# Patient Record
Sex: Male | Born: 1960 | Race: White | Hispanic: No | Marital: Married | Smoking: Former smoker
Health system: Southern US, Community
[De-identification: ages and names within clinical notes are randomized; demographics above are authoritative.]

## PROBLEM LIST (undated history)

## (undated) HISTORY — PX: APPENDECTOMY: SHX54

## (undated) HISTORY — PX: COLON SURGERY: SHX602

## (undated) HISTORY — PX: CHOLECYSTECTOMY: SHX55

---

## 2010-08-20 ENCOUNTER — Emergency Department (HOSPITAL_BASED_OUTPATIENT_CLINIC_OR_DEPARTMENT_OTHER)
Admission: EM | Admit: 2010-08-20 | Discharge: 2010-08-20 | Disposition: A | Discharge status: Other Acute Inpatient Hospital | Payer: BC Managed Care – PPO | Attending: Emergency Medicine | Admitting: Emergency Medicine

## 2010-08-20 ENCOUNTER — Emergency Department (INDEPENDENT_AMBULATORY_CARE_PROVIDER_SITE_OTHER): Payer: BC Managed Care – PPO

## 2010-08-20 DIAGNOSIS — R109 Unspecified abdominal pain: Secondary | ICD-10-CM

## 2010-08-20 DIAGNOSIS — K7689 Other specified diseases of liver: Secondary | ICD-10-CM | POA: Insufficient documentation

## 2010-08-20 DIAGNOSIS — R110 Nausea: Secondary | ICD-10-CM

## 2010-08-20 DIAGNOSIS — R1031 Right lower quadrant pain: Secondary | ICD-10-CM | POA: Insufficient documentation

## 2010-08-20 DIAGNOSIS — K389 Disease of appendix, unspecified: Secondary | ICD-10-CM

## 2010-08-20 DIAGNOSIS — K3580 Unspecified acute appendicitis: Secondary | ICD-10-CM | POA: Insufficient documentation

## 2010-08-20 LAB — COMPREHENSIVE METABOLIC PANEL
AST: 28 U/L (ref 0–37)
Albumin: 4.6 g/dL (ref 3.5–5.2)
Alkaline Phosphatase: 85 U/L (ref 39–117)
Chloride: 101 mEq/L (ref 96–112)
Creatinine, Ser: 1.2 mg/dL (ref 0.4–1.5)
GFR calc Af Amer: >60 mL/min (ref >60)
Potassium: 4 mEq/L (ref 3.5–5.1)
Sodium: 139 mEq/L (ref 135–145)
Total Bilirubin: 2.7 mg/dL — ABNORMAL HIGH (ref 0.3–1.2)

## 2010-08-20 LAB — URINE MICROSCOPIC-ADD ON

## 2010-08-20 LAB — URINALYSIS, ROUTINE W REFLEX MICROSCOPIC
Hgb urine dipstick: NEGATIVE
Ketones, ur: 15 mg/dL — AB
Protein, ur: 30 mg/dL — AB
Urine Glucose, Fasting: NEGATIVE mg/dL

## 2010-08-20 LAB — CBC
Hemoglobin: 16.7 g/dL (ref 13.0–17.0)
MCH: 30.2 pg (ref 26.0–34.0)
MCHC: 35.6 g/dL (ref 30.0–36.0)

## 2010-08-20 LAB — DIFFERENTIAL
Basophils Relative: 0 % (ref 0–1)
Monocytes Absolute: 2.4 10*3/uL — ABNORMAL HIGH (ref 0.1–1.0)
Monocytes Relative: 10 % (ref 3–12)
Neutro Abs: 17.8 10*3/uL — ABNORMAL HIGH (ref 1.7–7.7)

## 2010-08-20 MED ORDER — IOHEXOL 300 MG/ML  SOLN
100.0000 mL | Freq: Once | INTRAMUSCULAR | Status: AC | PRN
  Administered 2010-08-20: 100 mL via INTRAVENOUS

## 2010-11-26 ENCOUNTER — Emergency Department (HOSPITAL_BASED_OUTPATIENT_CLINIC_OR_DEPARTMENT_OTHER)
Admission: EM | Admit: 2010-11-26 | Discharge: 2010-11-26 | Disposition: A | Discharge status: Home / Self Care | Payer: BC Managed Care – PPO | Attending: Emergency Medicine | Admitting: Emergency Medicine

## 2010-11-26 ENCOUNTER — Emergency Department (INDEPENDENT_AMBULATORY_CARE_PROVIDER_SITE_OTHER): Payer: BC Managed Care – PPO

## 2010-11-26 DIAGNOSIS — R29810 Facial weakness: Secondary | ICD-10-CM | POA: Insufficient documentation

## 2010-11-26 DIAGNOSIS — H547 Unspecified visual loss: Secondary | ICD-10-CM

## 2010-11-26 DIAGNOSIS — R51 Headache: Secondary | ICD-10-CM

## 2010-11-26 DIAGNOSIS — R209 Unspecified disturbances of skin sensation: Secondary | ICD-10-CM | POA: Insufficient documentation

## 2010-11-26 DIAGNOSIS — G510 Bell's palsy: Secondary | ICD-10-CM | POA: Insufficient documentation

## 2012-02-07 ENCOUNTER — Encounter (HOSPITAL_BASED_OUTPATIENT_CLINIC_OR_DEPARTMENT_OTHER): Payer: Self-pay

## 2012-02-07 ENCOUNTER — Emergency Department (HOSPITAL_BASED_OUTPATIENT_CLINIC_OR_DEPARTMENT_OTHER): Payer: BC Managed Care – PPO

## 2012-02-07 ENCOUNTER — Emergency Department (HOSPITAL_BASED_OUTPATIENT_CLINIC_OR_DEPARTMENT_OTHER)
Admission: EM | Admit: 2012-02-07 | Discharge: 2012-02-07 | Disposition: A | Discharge status: Home / Self Care | Payer: BC Managed Care – PPO | Attending: Emergency Medicine | Admitting: Emergency Medicine

## 2012-02-07 DIAGNOSIS — M545 Low back pain, unspecified: Secondary | ICD-10-CM | POA: Insufficient documentation

## 2012-02-07 DIAGNOSIS — R109 Unspecified abdominal pain: Secondary | ICD-10-CM | POA: Insufficient documentation

## 2012-02-07 DIAGNOSIS — Z9089 Acquired absence of other organs: Secondary | ICD-10-CM | POA: Insufficient documentation

## 2012-02-07 LAB — URINALYSIS, ROUTINE W REFLEX MICROSCOPIC
Nitrite: NEGATIVE
Specific Gravity, Urine: 1.026 (ref 1.005–1.030)
Urobilinogen, UA: 0.2 mg/dL (ref 0.0–1.0)

## 2012-02-07 MED ORDER — OXYCODONE-ACETAMINOPHEN 5-325 MG PO TABS: 1.0000 | ORAL_TABLET | ORAL | Status: AC | PRN

## 2012-02-07 MED ORDER — CYCLOBENZAPRINE HCL 10 MG PO TABS: 10.0000 mg | ORAL_TABLET | Freq: Three times a day (TID) | ORAL | Status: AC | PRN

## 2012-02-07 NOTE — ED Notes (Signed)
Pt reports right side/flank pain that started two months ago and worsened this morning.

## 2012-02-07 NOTE — ED Provider Notes (Signed)
History     CSN: [id number]  Arrival date & time 02/07/12  1422   First MD Initiated Contact with Patient 02/07/12 1623      Chief Complaint  Patient presents with  . Flank Pain    (Consider location/radiation/quality/duration/timing/severity/associated sxs/prior treatment) HPI Comments: HPI Comments: The patient is a 51 year old man who woke up this morning with pain in the right side and back. Longer the day went towards heard. He's had no blood in the urine he denies any injury. His health is usually very good his only prior medical care recently was an appendectomy last year.  Patient is a 51 y.o. male presenting with back pain. The history is provided by the patient and medical records. No language interpreter was used.  Back Pain  This is a new problem. The current episode started 6 to 12 hours ago. The problem occurs constantly. Progression since onset: He has less pain lying flat or standing, more sitting riding in a car. The pain is associated with no known injury. The pain is present in the lumbar spine. The quality of the pain is described as stabbing. The pain does not radiate. Pain scale: His pain is an 820 moving around, it to when he is at rest. Pain severity now:  sitting,  riding in a car. Pertinent negatives include no fever.    Patient is a 51 y.o. male presenting with flank pain.  Flank Pain    History reviewed. No pertinent past medical history.  Past Surgical History  Procedure Date  . Appendectomy     No family history on file.  History  Substance Use Topics  . Smoking status: Never Smoker   . Smokeless tobacco: Never Used  . Alcohol Use: Yes     rarely      Review of Systems  Constitutional: Negative.   HENT: Negative.   Eyes: Negative.   Respiratory: Negative.   Cardiovascular: Negative.   Gastrointestinal: Negative.   Genitourinary: Positive for flank pain.  Musculoskeletal: Positive for back pain.  Skin: Negative.   Neurological:  Negative.   Psychiatric/Behavioral: Negative.     Allergies  Review of patient's allergies indicates no known allergies.  Home Medications   Current Outpatient Rx  Name Route Sig Dispense Refill  . NAPROXEN PO Oral Take 2 tablets by mouth daily as needed. For pain.    . CYCLOBENZAPRINE HCL 10 MG PO TABS Oral Take 1 tablet (10 mg total) by mouth 3 (three) times daily as needed for muscle spasms. 15 tablet 0  . OXYCODONE-ACETAMINOPHEN 5-325 MG PO TABS Oral Take 1 tablet by mouth every 4 (four) hours as needed for pain. 20 tablet 0    BP 155/97  Pulse 63  Temp 98.1 F (36.7 C) (Oral)  Resp 20  Ht 6\' 1"  (1.854 m)  Wt 305 lb (138.347 kg)  BMI 40.24 kg/m2  SpO2 99%  Physical Exam  Nursing note and vitals reviewed. Constitutional: He is oriented to person, place, and time.       Robust middle aged man, in mild distress, supine on his back.  HENT:  Head: Normocephalic and atraumatic.  Right Ear: External ear normal.  Left Ear: External ear normal.  Mouth/Throat: Oropharynx is clear and moist.  Eyes: Conjunctivae and EOM are normal. Pupils are equal, round, and reactive to light.  Neck: Normal range of motion. Neck supple. No thyromegaly present.  Cardiovascular: Normal rate, regular rhythm and normal heart sounds.   Pulmonary/Chest: Breath sounds normal.  Abdominal:  Soft. Bowel sounds are normal.  Musculoskeletal:       He localizes pain to the right flank.  There is no bony deformity of the lumbar region.    Neurological: He is alert and oriented to person, place, and time.       No sensory or motor deficit.  Skin: Skin is warm and dry.  Psychiatric: He has a normal mood and affect. His behavior is normal.    ED Course  Procedures (including critical care time)   Labs Reviewed  URINALYSIS, ROUTINE W REFLEX MICROSCOPIC   Ct Abdomen Pelvis Wo Contrast  02/07/2012  *RADIOLOGY REPORT*  Clinical Data: Right flank pain  CT ABDOMEN AND PELVIS WITHOUT CONTRAST  Technique:   Multidetector CT imaging of the abdomen and pelvis was performed following the standard protocol without intravenous contrast.  Comparison: 08/20/2010  Findings: No hydronephrosis.  No renal calculus.  Benign cyst in the left kidney.  No ureteral calculus.  Post cholecystectomy.  Diffuse hepatic steatosis.  Spleen, pancreas, adrenal glands are within normal limits.  No free fluid.  Post appendectomy.  Bladder is decompressed.  Unremarkable prostate.  No destructive bone lesion.  Mild degenerative changes in the lumbar spine.  IMPRESSION: No urinary calculus.  Original Report Authenticated By: Donavan Burnet, M.D.   UA negative and CT of abdomen/pelvis showed no evidence of kidney stone.  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  Rx for Low back pain with Percocet for pain, Flexeril for muscle spasm, rest on firm surface with heating pad on a low setting.  1. Low back pain            Carleene Cooper III, MD 02/07/12 2148

## 2017-01-31 ENCOUNTER — Encounter (HOSPITAL_BASED_OUTPATIENT_CLINIC_OR_DEPARTMENT_OTHER): Payer: Self-pay

## 2017-01-31 ENCOUNTER — Emergency Department (HOSPITAL_BASED_OUTPATIENT_CLINIC_OR_DEPARTMENT_OTHER)
Admission: EM | Admit: 2017-01-31 | Discharge: 2017-01-31 | Disposition: A | Discharge status: Home / Self Care | Payer: BLUE CROSS/BLUE SHIELD | Attending: Emergency Medicine | Admitting: Emergency Medicine

## 2017-01-31 ENCOUNTER — Emergency Department (HOSPITAL_BASED_OUTPATIENT_CLINIC_OR_DEPARTMENT_OTHER): Payer: BLUE CROSS/BLUE SHIELD

## 2017-01-31 DIAGNOSIS — M549 Dorsalgia, unspecified: Secondary | ICD-10-CM | POA: Diagnosis present

## 2017-01-31 DIAGNOSIS — Z87891 Personal history of nicotine dependence: Secondary | ICD-10-CM | POA: Diagnosis not present

## 2017-01-31 DIAGNOSIS — M545 Low back pain, unspecified: Secondary | ICD-10-CM

## 2017-01-31 MED ORDER — NAPROXEN 375 MG PO TABS: 375.0000 mg | ORAL_TABLET | Freq: Two times a day (BID) | ORAL | 0 refills | Status: AC

## 2017-01-31 MED ORDER — CYCLOBENZAPRINE HCL 10 MG PO TABS: 10.0000 mg | ORAL_TABLET | Freq: Two times a day (BID) | ORAL | 0 refills | Status: AC | PRN

## 2017-01-31 MED ORDER — ACETAMINOPHEN 500 MG PO TABS: 500.0000 mg | ORAL_TABLET | Freq: Four times a day (QID) | ORAL | 0 refills | Status: AC | PRN

## 2017-01-31 NOTE — Discharge Instructions (Signed)
Medications: Flexeril, naprosyn, Tylenol  Treatment: Take Flexeril twice daily for muscle pain and spasms. Do not drive or operate machinery while taking this medication as it can make you very sleepy. Take naprosyn twice daily as prescribed for your pain. You can alternate with Tylenol as prescribed. Use a heating pad 3-4 times daily alternating 20 mintues on, 20 minutes off. Attempt the back exercises and strecthes 1-2 times daily as tolerated.  Follow-up: Please follow up with your primary care provider or the orthopedic doctor outlined below if your symptoms are not improving over the next 7-10 days. Please return to the emergency department if you develop any new or worsening symptoms, including complete numbness of your legs or groin area, loss of bowel or bladder control, inability to walk, or any other new or concerning symptom.

## 2017-01-31 NOTE — ED Triage Notes (Signed)
C/o lower back pain x 5 months after crawling through a car window-NAD-steady gait

## 2017-01-31 NOTE — ED Notes (Signed)
ED Provider at bedside. 

## 2017-01-31 NOTE — ED Provider Notes (Signed)
[facility] Provider Note   CSN: [id number] Arrival date & time: 01/31/17  1308     History   Chief Complaint Chief Complaint  Patient presents with  . Back Pain    HPI Blake Shaw is a 56 y.o. male who is previously healthy who presents with a 5 month history of low back pain. His pain started after crawling in and out of the car when his car broke down and he could not open the door to choke are helping him. He reports resting his back on the windowsill and hearing several pops. Incident, patient has had low back pain. He occasionally has pain radiating to his right mid thigh, however no other radiation of pain. He denies any numbness or tingling. He also denies any bowel or bladder incontinence, saddle anesthesia, fevers, weight loss, urinary symptoms, recent procedure to back, known cancer, history of IVDU. He has not tried any medication at home for his symptoms. He has tried some stretching and exercises as well as hot showers, however his pain persists.   HPI  History reviewed. No pertinent past medical history.  There are no active problems to display for this patient.   Past Surgical History:  Procedure Laterality Date  . APPENDECTOMY    . CHOLECYSTECTOMY    . COLON SURGERY         Home Medications    Prior to Admission medications   Medication Sig Start Date End Date Taking? Authorizing Provider  acetaminophen (TYLENOL) 500 MG tablet Take 1 tablet (500 mg total) by mouth every 6 (six) hours as needed. 01/31/17   Donye Campanelli, Waylan BogaAlexandra M, PA-C  cyclobenzaprine (FLEXERIL) 10 MG tablet Take 1 tablet (10 mg total) by mouth 2 (two) times daily as needed for muscle spasms. 01/31/17   Merrily Tegeler, Waylan BogaAlexandra M, PA-C  naproxen (NAPROSYN) 375 MG tablet Take 1 tablet (375 mg total) by mouth 2 (two) times daily. 01/31/17   Emi HolesLaw, Kassidie Hendriks M, PA-C    Family History No family history on file.  Social History Social History  Substance Use Topics  . Smoking status: Former Games developermoker    . Smokeless tobacco: Never Used  . Alcohol use No     Allergies   Patient has no known allergies.   Review of Systems Review of Systems  Constitutional: Negative for chills and fever.  HENT: Negative for facial swelling and sore throat.   Genitourinary: Negative for dysuria.  Musculoskeletal: Positive for back pain.  Skin: Negative for rash and wound.  Neurological: Negative for numbness and headaches.  Psychiatric/Behavioral: The patient is not nervous/anxious.      Physical Exam Updated Vital Signs BP (!) 151/107 (BP Location: Left Arm)   Pulse 88   Temp 98.9 F (37.2 C) (Oral)   Resp 20   Ht 6\' 2"  (1.88 m)   Wt (!) 138.3 kg (305 lb)   SpO2 98%   BMI 39.16 kg/m   Physical Exam  Constitutional: He appears well-developed and well-nourished. No distress.  HENT:  Head: Normocephalic and atraumatic.  Mouth/Throat: Oropharynx is clear and moist. No oropharyngeal exudate.  Eyes: Pupils are equal, round, and reactive to light. Conjunctivae are normal. Right eye exhibits no discharge. Left eye exhibits no discharge. No scleral icterus.  Neck: Normal range of motion. Neck supple. No thyromegaly present.  Cardiovascular: Normal rate, regular rhythm, normal heart sounds and intact distal pulses.  Exam reveals no gallop and no friction rub.   No murmur heard. Pulmonary/Chest: Effort normal and breath  sounds normal. No stridor. No respiratory distress. He has no wheezes. He has no rales.  Abdominal: Soft. Bowel sounds are normal. He exhibits no distension. There is no tenderness. There is no rebound and no guarding.  Musculoskeletal: He exhibits no edema.       Lumbar back: He exhibits tenderness, bony tenderness and spasm.       Back:  Lymphadenopathy:    He has no cervical adenopathy.  Neurological: He is alert. Coordination normal.  Reflex Scores:      Patellar reflexes are 2+ on the right side and 2+ on the left side. Normal sensation and 5/5 strength lower extremities   Skin: Skin is warm and dry. No rash noted. He is not diaphoretic. No pallor.  Psychiatric: He has a normal mood and affect.  Nursing note and vitals reviewed.    ED Treatments / Results  Labs (all labs ordered are listed, but only abnormal results are displayed) Labs Reviewed - No data to display  EKG  EKG Interpretation None       Radiology Dg Lumbar Spine Complete  Result Date: 01/31/2017 CLINICAL DATA:  Low back pain 5 months EXAM: LUMBAR SPINE - COMPLETE 4+ VIEW COMPARISON:  CT abdomen pelvis 02/07/2012 FINDINGS: Normal alignment. Negative for fracture or pars defect. Mild disc space narrowing and spurring at L1-2, L2-3, L3-4. L4-5 and L5-S1 intact. IMPRESSION: Mild degenerative change.  No acute abnormality. Electronically Signed   By: Marlan Palauharles  Clark M.D.   On: 01/31/2017 13:57    Procedures Procedures (including critical care time)  Medications Ordered in ED Medications - No data to display   Initial Impression / Assessment and Plan / ED Course  I have reviewed the triage vital signs and the nursing notes.  Pertinent labs & imaging results that were available during my care of the patient were reviewed by me and considered in my medical decision making (see chart for details).     Patient with back pain.  X-ray shows mild degenerative change without acute abnormality. No neurological deficits and normal neuro exam.  Patient is ambulatory.  No loss of bowel or bladder control.  No concern for cauda equina.  No fever, night sweats, weight loss, h/o cancer, IVDA, no recent procedure to back. No urinary symptoms suggestive of UTI.  Supportive care, including Flexeril, Naprosyn, Tylenol, stretching/exercises, heat, and return precaution discussed. Appears safe for discharge at this time. Follow up With PCP or sports medicine, Dr. Pearletha ForgeHudnall. Patient understands and agrees with plan. Patient vitals stable throughout ED course discharged in satisfactory condition  Final Clinical  Impressions(s) / ED Diagnoses   Final diagnoses:  Acute bilateral low back pain without sciatica    New Prescriptions New Prescriptions   ACETAMINOPHEN (TYLENOL) 500 MG TABLET    Take 1 tablet (500 mg total) by mouth every 6 (six) hours as needed.   CYCLOBENZAPRINE (FLEXERIL) 10 MG TABLET    Take 1 tablet (10 mg total) by mouth 2 (two) times daily as needed for muscle spasms.   NAPROXEN (NAPROSYN) 375 MG TABLET    Take 1 tablet (375 mg total) by mouth 2 (two) times daily.     Emi HolesLaw, Brenda Samano M, PA-C 01/31/17 1407    Benjiman CorePickering, Nathan, MD 01/31/17 [phone]

## 2017-12-11 IMAGING — CR DG LUMBAR SPINE COMPLETE 4+V
5 series · 5 of 5 positions shown · non-contrast
Comparison: CT abdomen pelvis 02/07/2012

CLINICAL DATA: Low back pain 5 months

EXAM:
LUMBAR SPINE - COMPLETE 4+ VIEW

[t l-spine a.p.]
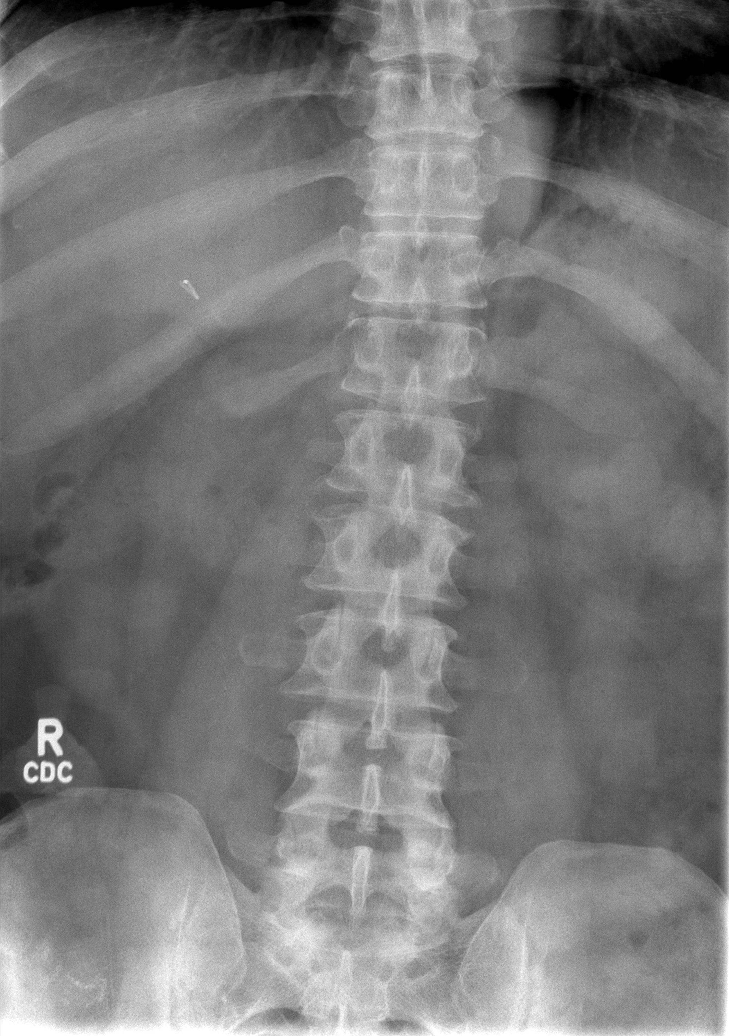

[t l-spine oblique exposure (1 of 2)]
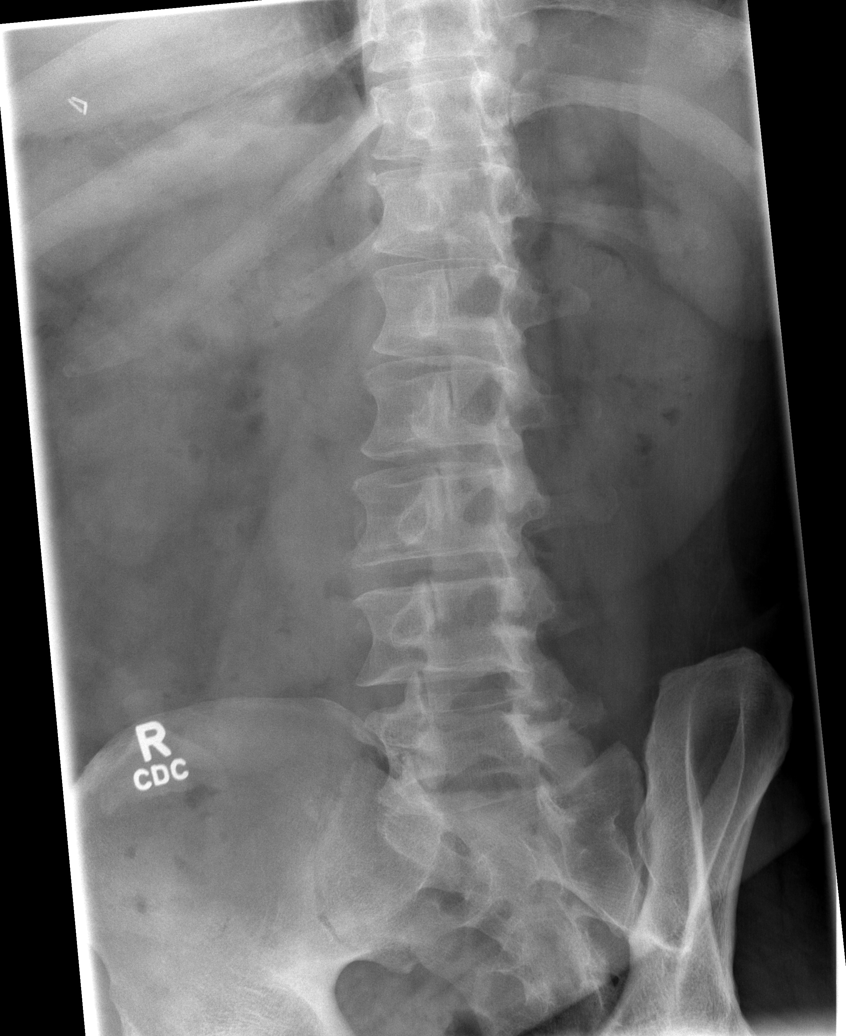

[t l-spine oblique exposure (2 of 2)]
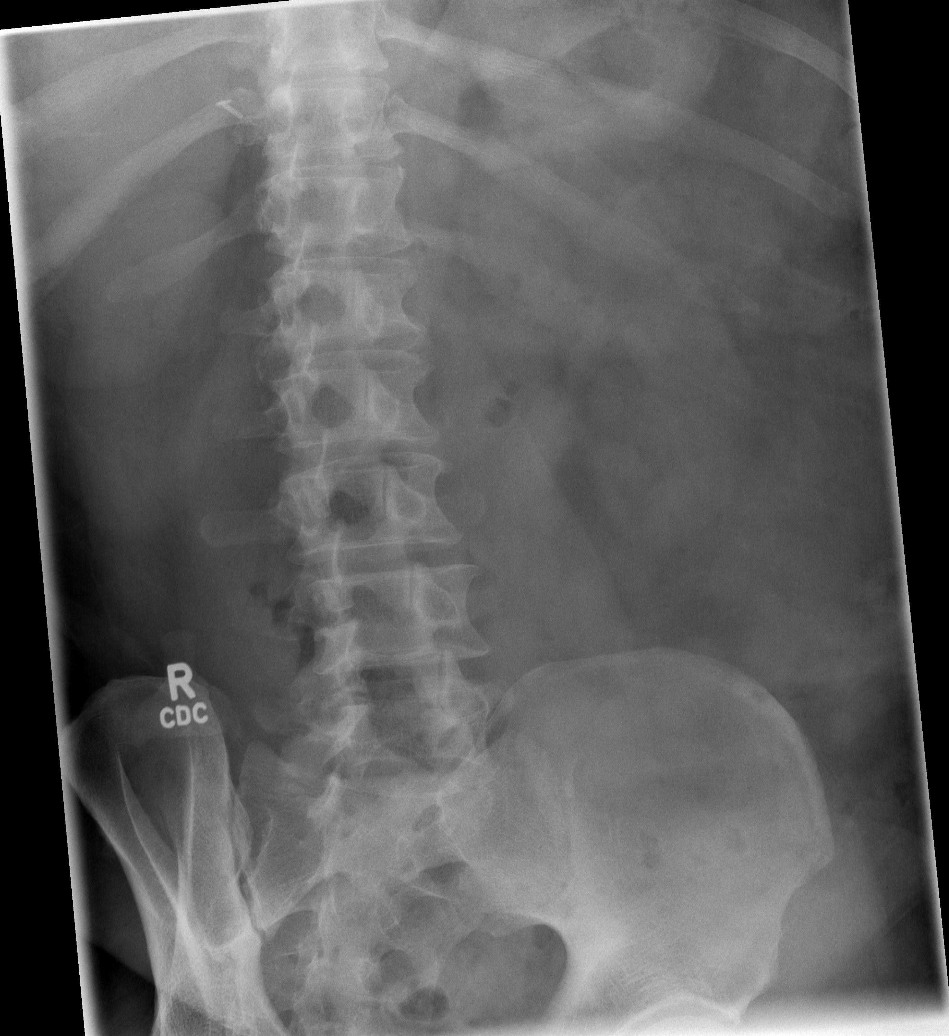

[t l-spine lat]
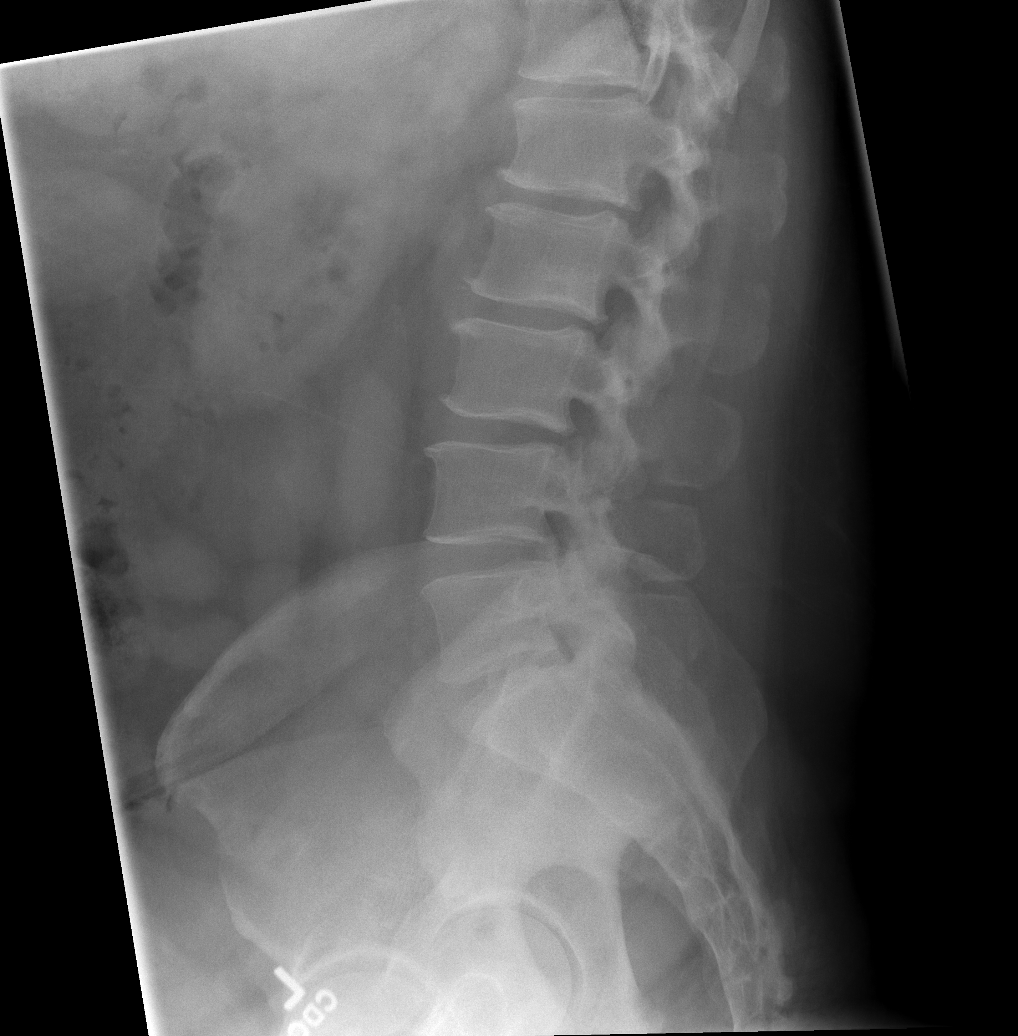

[t l-spine l5-s1 spot]
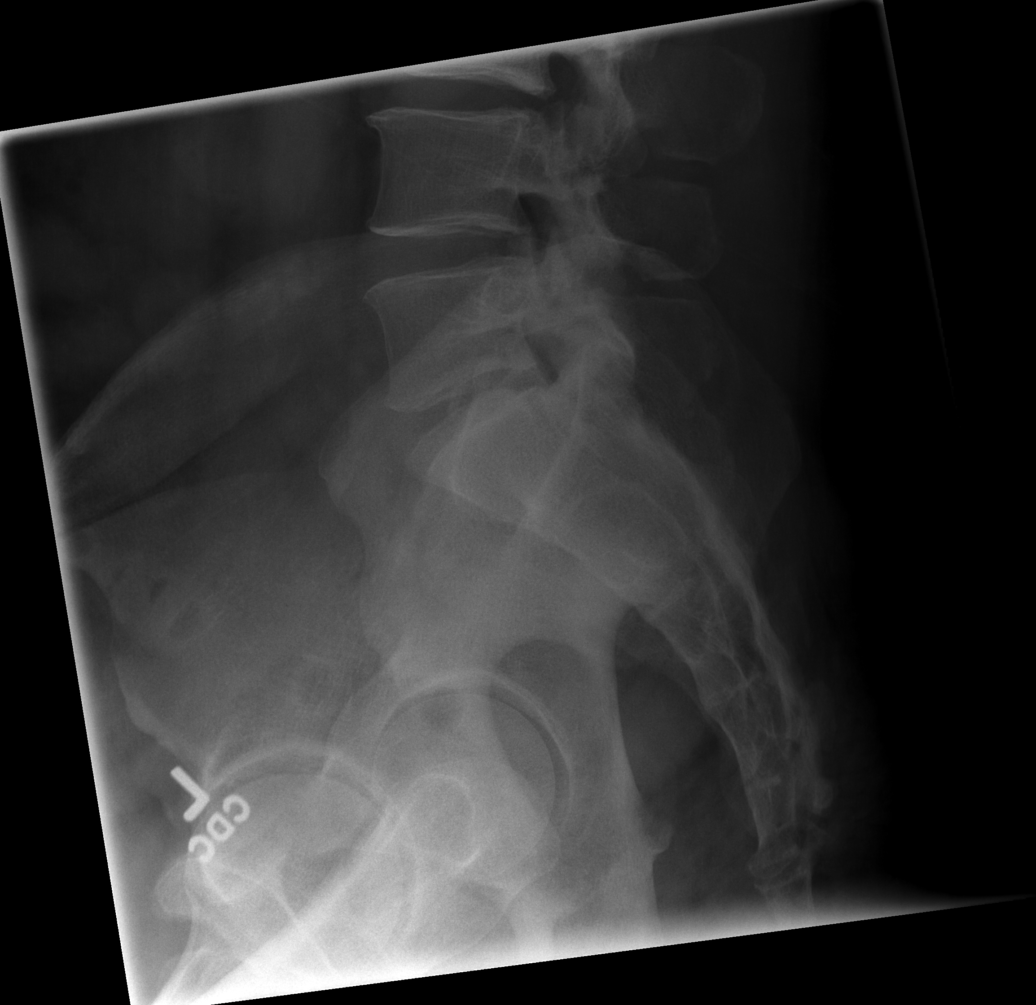

[5 of 5 positions shown; findings below may reference images not displayed]

FINDINGS: Normal alignment. Negative for fracture or pars defect. Mild disc
space narrowing and spurring at L1-2, L2-3, L3-4. L4-5 and L5-S1
intact.
IMPRESSION: Mild degenerative change.  No acute abnormality.
